# Patient Record
Sex: Male | Born: 1985 | Race: Black or African American | Hispanic: No | Marital: Single | Smoking: Current every day smoker
Health system: Southern US, Community
[De-identification: ages and names within clinical notes are randomized; demographics above are authoritative.]

---

## 2008-02-06 ENCOUNTER — Emergency Department (HOSPITAL_COMMUNITY): Admission: EM | Admit: 2008-02-06 | Discharge: 2008-02-06 | Payer: Self-pay | Admitting: Emergency Medicine

## 2018-05-01 ENCOUNTER — Emergency Department (HOSPITAL_COMMUNITY)
Admission: EM | Admit: 2018-05-01 | Discharge: 2018-05-01 | Disposition: A | Discharge status: Home / Self Care | Payer: Managed Care, Other (non HMO) | Attending: Emergency Medicine | Admitting: Emergency Medicine

## 2018-05-01 ENCOUNTER — Emergency Department (HOSPITAL_COMMUNITY): Payer: Managed Care, Other (non HMO)

## 2018-05-01 ENCOUNTER — Encounter (HOSPITAL_COMMUNITY): Payer: Self-pay | Admitting: Emergency Medicine

## 2018-05-01 ENCOUNTER — Other Ambulatory Visit: Payer: Self-pay

## 2018-05-01 DIAGNOSIS — Y9389 Activity, other specified: Secondary | ICD-10-CM | POA: Insufficient documentation

## 2018-05-01 DIAGNOSIS — R0789 Other chest pain: Secondary | ICD-10-CM

## 2018-05-01 DIAGNOSIS — Y929 Unspecified place or not applicable: Secondary | ICD-10-CM | POA: Diagnosis not present

## 2018-05-01 DIAGNOSIS — Y998 Other external cause status: Secondary | ICD-10-CM | POA: Diagnosis not present

## 2018-05-01 DIAGNOSIS — R4020 Unspecified coma: Secondary | ICD-10-CM | POA: Insufficient documentation

## 2018-05-01 DIAGNOSIS — R079 Chest pain, unspecified: Secondary | ICD-10-CM | POA: Diagnosis present

## 2018-05-01 MED ORDER — IOPAMIDOL (ISOVUE-370) INJECTION 76%
80.00 | INTRAVENOUS | Status: DC
Start: ? — End: 2018-05-01

## 2018-05-01 NOTE — Discharge Instructions (Addendum)
Please read attached information. If you experience any new or worsening signs or symptoms please return to the emergency room for evaluation. Please follow-up with your primary care provider or specialist as discussed.  °
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
## 2018-05-01 NOTE — ED Notes (Signed)
Pt transported to xray 

## 2018-05-01 NOTE — ED Notes (Signed)
Patient verbalizes understanding of discharge instructions. Opportunity for questioning and answers were provided. Armband removed by staff, pt discharged from ED.  

## 2018-05-10 ENCOUNTER — Emergency Department (HOSPITAL_COMMUNITY): Payer: Managed Care, Other (non HMO)

## 2018-05-10 ENCOUNTER — Encounter (HOSPITAL_COMMUNITY): Payer: Self-pay | Admitting: Emergency Medicine

## 2018-05-10 ENCOUNTER — Other Ambulatory Visit: Payer: Self-pay

## 2018-05-10 ENCOUNTER — Emergency Department (HOSPITAL_COMMUNITY)
Admission: EM | Admit: 2018-05-10 | Discharge: 2018-05-10 | Disposition: A | Discharge status: Home / Self Care | Payer: Managed Care, Other (non HMO) | Attending: Emergency Medicine | Admitting: Emergency Medicine

## 2018-05-10 DIAGNOSIS — F17210 Nicotine dependence, cigarettes, uncomplicated: Secondary | ICD-10-CM | POA: Diagnosis not present

## 2018-05-10 DIAGNOSIS — M79642 Pain in left hand: Secondary | ICD-10-CM | POA: Insufficient documentation

## 2018-05-10 DIAGNOSIS — M25532 Pain in left wrist: Secondary | ICD-10-CM | POA: Diagnosis not present

## 2018-05-10 MED ORDER — IBUPROFEN 400 MG PO TABS
600.0000 mg | ORAL_TABLET | Freq: Once | ORAL | Status: AC
  Administered 2018-05-10: 600 mg via ORAL
  Filled 2018-05-10: qty 1

## 2018-05-10 NOTE — Discharge Instructions (Signed)
Your x-rays show no evidence of fracture, you may have a sprain, but I would like free to use wrist brace, Naprosyn twice daily, and Tylenol as needed for breakthrough pain, do not combine with any other over-the-counter pain relievers.  You can use ice and heat as needed.  If symptoms are not improving you will need to follow-up with Dr. Everardo PacificVarkey with orthopedics for further evaluation.

## 2018-05-10 NOTE — ED Notes (Signed)
Paged ortho tech 

## 2018-05-10 NOTE — Care Management (Signed)
ED CM noted patient not to have a PCP. CM met with patient, he confirmed information. Discussed [facility] to establish primary care, patient is agreeable. CM provided patient with written information concerning the [facility], instructed patient to contact the clinic tomorrow after 9a to schedule an ED follow up appointment. No further ED CM needs identified

## 2018-05-10 NOTE — ED Notes (Signed)
Patient transported to X-ray 

## 2018-05-10 NOTE — Progress Notes (Signed)
Orthopedic Tech Progress Note Patient Details:  Melida GimenezFranklin Hannis [birth year] [id number]  Ortho Devices Type of Ortho Device: Thumb velcro splint Ortho Device/Splint Location: lue Ortho Device/Splint Interventions: Ordered, Application, Adjustment   Post Interventions Patient Tolerated: Well Instructions Provided: Care of device, Adjustment of device   Trinna PostMartinez, Bear Osten J 05/10/2018, 11:03 AM
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
## 2018-05-23 ENCOUNTER — Emergency Department (HOSPITAL_COMMUNITY)
Admission: EM | Admit: 2018-05-23 | Discharge: 2018-05-23 | Disposition: A | Discharge status: Home / Self Care | Payer: Managed Care, Other (non HMO) | Attending: Emergency Medicine | Admitting: Emergency Medicine

## 2018-05-23 DIAGNOSIS — F17210 Nicotine dependence, cigarettes, uncomplicated: Secondary | ICD-10-CM | POA: Insufficient documentation

## 2018-05-23 DIAGNOSIS — B349 Viral infection, unspecified: Secondary | ICD-10-CM | POA: Insufficient documentation

## 2018-05-23 DIAGNOSIS — R6883 Chills (without fever): Secondary | ICD-10-CM | POA: Diagnosis present

## 2018-05-23 LAB — INFLUENZA PANEL BY PCR (TYPE A & B)
Influenza A By PCR: NEGATIVE
Influenza B By PCR: NEGATIVE

## 2018-05-23 MED ORDER — IBUPROFEN 800 MG PO TABS
800.0000 mg | ORAL_TABLET | Freq: Once | ORAL | Status: AC
  Administered 2018-05-23: 800 mg via ORAL
  Filled 2018-05-23: qty 1

## 2018-05-23 NOTE — Discharge Instructions (Signed)
Your flu swab is negative.  It is likely that your symptoms are due to a viral illness which will resolve with supportive measures.  Drink plenty of fluids to prevent dehydration.  Take 600 mg ibuprofen every 6 hours for management of body aches and/or fever.  Follow-up with a primary care doctor to ensure that symptoms resolve.
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
## 2018-05-23 NOTE — ED Provider Notes (Signed)
[facility] EMERGENCY DEPARTMENT Provider Note   CSN: [id number] Arrival date & time: 05/23/18  0208     History   Chief Complaint No chief complaint on file.   HPI Kent Gonzalez is a 32 y.o. male.   32 year old male with no significant past medical history presents to the emergency department for evaluation of chills.  He states that he has been having constant full body chills for 24 hours.  Symptoms associated with a nonproductive cough as well as slight sore throat.  He works at [employer] and states that other individuals have been sick.  He denies taking any medications for his symptoms.  No fevers, chest pain, shortness of breath, nausea, vomiting, nasal congestion, rhinorrhea.  Expresses concern for influenza.  The history is provided by the patient. No language interpreter was used.    No past medical history on file.  There are no active problems to display for this patient.   No past surgical history on file.      Home Medications    Prior to Admission medications   Not on File    Family History No family history on file.  Social History Social History   Tobacco Use  . Smoking status: Current Every Day Smoker  . Smokeless tobacco: Current User  Substance Use Topics  . Alcohol use: Yes  . Drug use: Not Currently     Allergies   Patient has no known allergies.   Review of Systems Review of Systems Ten systems reviewed and are negative for acute change, except as noted in the HPI.    Physical Exam Updated Vital Signs BP 124/80   Pulse 70   Temp 99.7 F (37.6 C) (Oral)   Resp 19   SpO2 98%   Physical Exam  Constitutional: He is oriented to person, place, and time. He appears well-developed and well-nourished. No distress.  Nontoxic appearing and in NAD  HENT:  Head: Normocephalic and atraumatic.  Clear posterior oropharynx. Tolerating secretions. No tripoding or stridor.  Eyes: Conjunctivae and EOM are normal. No  scleral icterus.  Neck: Normal range of motion.  Cardiovascular: Normal rate, regular rhythm and intact distal pulses.  Pulmonary/Chest: Effort normal. No stridor. No respiratory distress. He has no wheezes. He has no rales.  Respirations even and unlabored. Lungs CTAB.  Musculoskeletal: Normal range of motion.  Neurological: He is alert and oriented to person, place, and time. He exhibits normal muscle tone. Coordination normal.  Skin: Skin is warm and dry. No rash noted. He is not diaphoretic. No erythema. No pallor.  Psychiatric: He has a normal mood and affect. His behavior is normal.  Nursing note and vitals reviewed.    ED Treatments / Results  Labs (all labs ordered are listed, but only abnormal results are displayed) Labs Reviewed  INFLUENZA PANEL BY PCR (TYPE A & B)    EKG EKG Interpretation  Date/Time:  Saturday May 23 2018 02:16:09 EST Ventricular Rate:  73 PR Interval:    QRS Duration: 92 QT Interval:  371 QTC Calculation: 409 R Axis:   95 Text Interpretation:  Sinus rhythm Borderline right axis deviation ST elev, probable normal early repol pattern No old tracing to compare Confirmed by Dione Booze ([id number]) on 05/23/2018 2:34:21 AM   Radiology No results found.  Procedures Procedures (including critical care time)  Medications Ordered in ED Medications  ibuprofen (ADVIL,MOTRIN) tablet 800 mg (has no administration in time range)     Initial Impression / Assessment and  Plan / ED Course  I have reviewed the triage vital signs and the nursing notes.  Pertinent labs & imaging results that were available during my care of the patient were reviewed by me and considered in my medical decision making (see chart for details).     Patients symptoms are consistent with viral illness. Discussed that antibiotics are not indicated for viral infections. Patient will be discharged with symptomatic treatment.  He verbalizes understanding and is agreeable with plan.   Pt is hemodynamically stable & in NAD prior to discharge.   Final Clinical Impressions(s) / ED Diagnoses   Final diagnoses:  Viral illness    ED Discharge Orders    None       Antony MaduraHumes, Shakerra Red, PA-C 05/23/18 0452    Dione BoozeGlick, David, MD 05/23/18 [phone]
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
